# Patient Record
Sex: Male | Born: 1985 | Race: White | Hispanic: No | Marital: Single | Smoking: Never smoker
Health system: Southern US, Community
[De-identification: ages and names within clinical notes are randomized; demographics above are authoritative.]

## PROBLEM LIST (undated history)

## (undated) HISTORY — PX: SHOULDER SURGERY: SHX246

---

## 2005-03-28 ENCOUNTER — Emergency Department (HOSPITAL_COMMUNITY): Admission: EM | Admit: 2005-03-28 | Discharge: 2005-03-28 | Payer: Self-pay | Admitting: Emergency Medicine

## 2013-08-17 ENCOUNTER — Emergency Department (HOSPITAL_COMMUNITY)
Admission: EM | Admit: 2013-08-17 | Discharge: 2013-08-17 | Disposition: A | Discharge status: Home / Self Care | Payer: BC Managed Care – PPO | Attending: Emergency Medicine | Admitting: Emergency Medicine

## 2013-08-17 ENCOUNTER — Encounter (HOSPITAL_COMMUNITY): Payer: Self-pay | Admitting: Emergency Medicine

## 2013-08-17 ENCOUNTER — Emergency Department (HOSPITAL_COMMUNITY): Payer: BC Managed Care – PPO

## 2013-08-17 DIAGNOSIS — Y92838 Other recreation area as the place of occurrence of the external cause: Secondary | ICD-10-CM

## 2013-08-17 DIAGNOSIS — X500XXA Overexertion from strenuous movement or load, initial encounter: Secondary | ICD-10-CM | POA: Insufficient documentation

## 2013-08-17 DIAGNOSIS — Y9239 Other specified sports and athletic area as the place of occurrence of the external cause: Secondary | ICD-10-CM | POA: Insufficient documentation

## 2013-08-17 DIAGNOSIS — R5383 Other fatigue: Secondary | ICD-10-CM

## 2013-08-17 DIAGNOSIS — R5381 Other malaise: Secondary | ICD-10-CM | POA: Insufficient documentation

## 2013-08-17 DIAGNOSIS — Z9889 Other specified postprocedural states: Secondary | ICD-10-CM | POA: Insufficient documentation

## 2013-08-17 DIAGNOSIS — S43016A Anterior dislocation of unspecified humerus, initial encounter: Secondary | ICD-10-CM | POA: Insufficient documentation

## 2013-08-17 DIAGNOSIS — Y93A2 Activity, calisthenics: Secondary | ICD-10-CM | POA: Insufficient documentation

## 2013-08-17 MED ORDER — HYDROMORPHONE HCL PF 1 MG/ML IJ SOLN
1.0000 mg | Freq: Once | INTRAMUSCULAR | Status: AC
  Administered 2013-08-17: 1 mg via INTRAVENOUS
  Filled 2013-08-17: qty 1

## 2013-08-17 MED ORDER — PROPOFOL 10 MG/ML IV BOLUS
0.5000 mg/kg | Freq: Once | INTRAVENOUS | Status: DC
  Filled 2013-08-17: qty 20

## 2013-08-17 MED ORDER — OXYCODONE-ACETAMINOPHEN 5-325 MG PO TABS: 2.0000 | ORAL_TABLET | Freq: Once | ORAL | Status: DC

## 2013-08-17 MED ORDER — PROPOFOL 10 MG/ML IV BOLUS
65.0000 mg | Freq: Once | INTRAVENOUS | Status: AC
  Administered 2013-08-17: 65 mg via INTRAVENOUS

## 2013-08-17 MED ORDER — KETAMINE HCL 10 MG/ML IJ SOLN
65.0000 mg | Freq: Once | INTRAMUSCULAR | Status: AC
  Administered 2013-08-17: 65 mg via INTRAVENOUS
  Filled 2013-08-17: qty 6.5

## 2013-08-17 MED ORDER — OXYCODONE-ACETAMINOPHEN 5-325 MG PO TABS: 1.0000 | ORAL_TABLET | ORAL | Status: DC | PRN

## 2013-08-17 MED ORDER — DIAZEPAM 2 MG PO TABS: 2.0000 mg | ORAL_TABLET | Freq: Once | ORAL | Status: DC

## 2013-08-17 MED ORDER — HYDROMORPHONE HCL PF 1 MG/ML IJ SOLN
INTRAMUSCULAR | Status: AC
  Filled 2013-08-17: qty 1

## 2013-08-17 MED ORDER — KETAMINE HCL 10 MG/ML IJ SOLN
1.0000 mg/kg | Freq: Once | INTRAMUSCULAR | Status: DC
  Filled 2013-08-17: qty 9.3

## 2013-08-17 MED ORDER — HYDROMORPHONE HCL PF 1 MG/ML IJ SOLN
1.0000 mg | Freq: Once | INTRAMUSCULAR | Status: AC
  Administered 2013-08-17: 1 mg via INTRAVENOUS

## 2013-08-17 MED ORDER — KETOROLAC TROMETHAMINE 30 MG/ML IJ SOLN: 30.0000 mg | Freq: Once | INTRAMUSCULAR | Status: DC

## 2013-08-17 NOTE — ED Notes (Signed)
Pt states that in 2006 he had 2 pins placed in the left anterior shoulder and 1 pin posteriorly placed for left shoulder dislocation. Pt states that he had started lifting weights again heavily 3 weeks ago, Was at the gym this morning and while doing jumping jacks his left shoulder popped out of place.

## 2013-08-17 NOTE — Discharge Instructions (Signed)
Shoulder Dislocation  Your shoulder is made up of three bones: the collar bone (clavicle); the shoulder blade (scapula), which includes the socket (glenoid cavity); and the upper arm bone (humerus). Your shoulder joint is the place where these bones meet. Strong, fibrous tissues hold these bones together (ligaments). Muscles and strong, fibrous tissues that connect the muscles to these bones (tendons) allow your arm to move through this joint. The range of motion of your shoulder joint is more extensive than most of your other joints, and the glenoid cavity is very shallow. That is the reason that your shoulder joint is one of the most unstable joints in your body. It is far more prone to dislocation than your other joints. Shoulder dislocation is when your humerus is forced out of your shoulder joint.  CAUSES  Shoulder dislocation is caused by a forceful impact on your shoulder. This impact usually is from an injury, such as a sports injury or a fall.  SYMPTOMS  Symptoms of shoulder dislocation include:   Deformity of your shoulder.   Intense pain.   Inability to move your shoulder joint.   Numbness, weakness, or tingling around your shoulder joint (your neck or down your arm).   Bruising or swelling around your shoulder.  DIAGNOSIS  In order to diagnose a dislocated shoulder, your caregiver will perform a physical exam. Your caregiver also may have an X-ray exam done to see if you have any broken bones. Magnetic resonance imaging (MRI) is a procedure that sometimes is done to help your caregiver see any damage to the soft tissues around your shoulder, particularly your rotator cuff tendons. Additionally, your caregiver also may have electromyography done to measure the electrical discharges produced in your muscles if you have signs or symptoms of nerve damage.  TREATMENT  A shoulder dislocation is treated by placing the humerus back in the joint (reduction). Your caregiver does this either manually (closed  reduction), by moving your humerus back into the joint through manipulation, or through surgery (open reduction). When your humerus is back in place, severe pain should improve almost immediately.  You also may need to have surgery if you have a weak shoulder joint or ligaments, and you have recurring shoulder dislocations, despite rehabilitation. In rare cases, surgery is necessary if your nerves or blood vessels are damaged during the dislocation.  After your reduction, your arm will be placed in a shoulder immobilizer or sling to keep it from moving. Your caregiver will have you wear your shoulder immoblizer or sling for 3 days to 3 weeks, depending on how serious your dislocation is. When your shoulder immobilizer or sling is removed, your caregiver may prescribe physical therapy to help improve the range of motion in your shoulder joint.  HOME CARE INSTRUCTIONS   The following measures can help to reduce pain and speed up the healing process:   Rest your injured joint. Do not move it. Avoid activities similar to the one that caused your injury.   Apply ice to your injured joint for the first day or two after your reduction or as directed by your caregiver. Applying ice helps to reduce inflammation and pain.   Put ice in a plastic bag.   Place a towel between your skin and the bag.   Leave the ice on for 15-20 minutes at a time, every 2 hours while you are awake.   Exercise your hand by squeezing a soft ball. This helps to eliminate stiffness and swelling in your hand and   shoulder immobilizer or sling becomes damaged.  Your pain becomes worse rather than better.  You lose feeling in your arm or hand, or they become white and cold. MAKE SURE YOU:   Understand these instructions.  Will watch your condition.  Will get help right away if you are not  doing well or get worse. Document Released: 01/08/2001 Document Revised: 07/08/2011 Document Reviewed: 02/03/2011 [facility] Patient Information 2014 [place], [place].  Dislocation or Subluxation Dislocation of a joint occurs when ends of two or more adjacent bones no longer touch each other. A subluxation is a minor form of a dislocation, in which two or more adjacent bones are no longer properly aligned. The most common joints susceptible to a dislocation are the shoulder, kneecap, and fingers.  SYMPTOMS   Sudden pain at the time of injury.  Noticeable deformity in the area of the joint.  Limited range of motion. CAUSES   Usually a traumatic injury that stretches or tears ligaments that surround a joint and hold the bones together.  Condition present at birth (congenital) in which the joint surfaces are shallow or abnormally formed.  Joint disease such as arthritis or other diseases of ligaments and tissues around a joint. RISK INCREASES WITH:  Repeated injury to a joint.  Previous dislocation of a joint.  Contact sports (football, rugby, hockey, lacrosse) or sports that require repetitive overhead arm motion (throwing, swimming, volleyball).  Rheumatoid arthritis.  Congenital joint condition. PREVENTION  Warm up and stretch properly before activity.  Maintain physical fitness:  Joint flexibility.  Muscle strength and endurance.  Cardiovascular fitness.  Wear proper protective equipment and ensure correct fit.  Learn and use proper technique. PROGNOSIS  This condition is usually curable with prompt treatment. After the dislocation has been put back in place, the joint may require immobilization with a cast, splint, or sling for 2 to 6 weeks, often followed by strength and stretching exercises that may be performed at home or with a therapist. RELATED COMPLICATIONS   Damage to nearby nerves or major blood vessels, causing numbness, coldness, or paleness.  Recurrent  injury to the joint.  Arthritis of affected joint.  Fracture of joint. TREATMENT Treatment initially involves realigning the bones (reduction) of the joint. Reductions should only be performed by someone who is trained in the procedure. After the joint is reduced, medicine and ice should be used to reduce pain and inflammation. The joint may be immobilized to allow for the muscles and ligaments to heal. If a joint is subjected to recurrent dislocations, surgery may be necessary to tighten or replace injured structures. After surgery, stretching and strengthening exercises may be required. These may be performed at home or with a therapist. MEDICATION   Patients may require medicine to help them relax (sedative) or muscle relaxants in order to reduce the joint.  If pain medicine is necessary, nonsteroidal anti-inflammatory medicines, such as aspirin and ibuprofen, or other minor pain relievers, such as acetaminophen, are often recommended.  Do not take pain medicine for 7 days before surgery.  Prescription pain relievers may be necessary. Use only as directed and only as much as you need. SEEK MEDICAL CARE IF:   Symptoms get worse or do not improve despite treatment.  You have difficulty moving a joint after injury.  Any extremity becomes numb, pale, or cool after injury. This is an emergency.  Dislocations or subluxations occur repeatedly. Document Released: 04/15/2005 Document Revised: 07/08/2011 Document Reviewed: 07/28/2008 [facility] Patient Information 2014 [place]Exit[place], [place]. Acetaminophen; Oxycodone tablets  What is this medicine? ACETAMINOPHEN; OXYCODONE (a set a MEE noe fen; ox i KOE done) is a pain reliever. It is used to treat mild to moderate pain. This medicine may be used for other purposes; ask your health care provider or pharmacist if you have questions. COMMON BRAND NAME(S): Endocet, Magnacet, Narvox, Percocet, Perloxx, Primalev, Primlev, Roxicet, Xolox What should I tell  my health care provider before I take this medicine? They need to know if you have any of these conditions: -brain tumor -Crohn's disease, inflammatory bowel disease, or ulcerative colitis -drug abuse or addiction -head injury -heart or circulation problems -if you often drink alcohol -kidney disease or problems going to the bathroom -liver disease -lung disease, asthma, or breathing problems -an unusual or allergic reaction to acetaminophen, oxycodone, other opioid analgesics, other medicines, foods, dyes, or preservatives -pregnant or trying to get pregnant -breast-feeding How should I use this medicine? Take this medicine by mouth with a full glass of water. Follow the directions on the prescription label. Take your medicine at regular intervals. Do not take your medicine more often than directed. Talk to your pediatrician regarding the use of this medicine in children. Special care may be needed. Patients over 593 years old may have a stronger reaction and need a smaller dose. Overdosage: If you think you have taken too much of this medicine contact a poison control center or emergency room at once. NOTE: This medicine is only for you. Do not share this medicine with others. What if I miss a dose? If you miss a dose, take it as soon as you can. If it is almost time for your next dose, take only that dose. Do not take double or extra doses. What may interact with this medicine? -alcohol -antihistamines -barbiturates like amobarbital, butalbital, butabarbital, methohexital, pentobarbital, phenobarbital, thiopental, and secobarbital -benztropine -drugs for bladder problems like solifenacin, trospium, oxybutynin, tolterodine, hyoscyamine, and methscopolamine -drugs for breathing problems like ipratropium and tiotropium -drugs for certain stomach or intestine problems like propantheline, homatropine methylbromide, glycopyrrolate, atropine, belladonna, and dicyclomine -general anesthetics  like etomidate, ketamine, nitrous oxide, propofol, desflurane, enflurane, halothane, isoflurane, and sevoflurane -medicines for depression, anxiety, or psychotic disturbances -medicines for sleep -muscle relaxants -naltrexone -narcotic medicines (opiates) for pain -phenothiazines like perphenazine, thioridazine, chlorpromazine, mesoridazine, fluphenazine, prochlorperazine, promazine, and trifluoperazine -scopolamine -tramadol -trihexyphenidyl This list may not describe all possible interactions. Give your health care provider a list of all the medicines, herbs, non-prescription drugs, or dietary supplements you use. Also tell them if you smoke, drink alcohol, or use illegal drugs. Some items may interact with your medicine. What should I watch for while using this medicine? Tell your doctor or health care professional if your pain does not go away, if it gets worse, or if you have new or a different type of pain. You may develop tolerance to the medicine. Tolerance means that you will need a higher dose of the medication for pain relief. Tolerance is normal and is expected if you take this medicine for a long time. Do not suddenly stop taking your medicine because you may develop a severe reaction. Your body becomes used to the medicine. This does NOT mean you are addicted. Addiction is a behavior related to getting and using a drug for a non-medical reason. If you have pain, you have a medical reason to take pain medicine. Your doctor will tell you how much medicine to take. If your doctor wants you to stop the medicine, the dose will be slowly lowered  over time to avoid any side effects. You may get drowsy or dizzy. Do not drive, use machinery, or do anything that needs mental alertness until you know how this medicine affects you. Do not stand or sit up quickly, especially if you are an older patient. This reduces the risk of dizzy or fainting spells. Alcohol may interfere with the effect of this  medicine. Avoid alcoholic drinks. There are different types of narcotic medicines (opiates) for pain. If you take more than one type at the same time, you may have more side effects. Give your health care provider a list of all medicines you use. Your doctor will tell you how much medicine to take. Do not take more medicine than directed. Call emergency for help if you have problems breathing. The medicine will cause constipation. Try to have a bowel movement at least every 2 to 3 days. If you do not have a bowel movement for 3 days, call your doctor or health care professional. Do not take Tylenol (acetaminophen) or medicines that have acetaminophen with this medicine. Too much acetaminophen can be very dangerous. Many nonprescription medicines contain acetaminophen. Always read the labels carefully to avoid taking more acetaminophen. What side effects may I notice from receiving this medicine? Side effects that you should report to your doctor or health care professional as soon as possible: -allergic reactions like skin rash, itching or hives, swelling of the face, lips, or tongue -breathing difficulties, wheezing -confusion -light headedness or fainting spells -severe stomach pain -unusually weak or tired -yellowing of the skin or the whites of the eyes  Side effects that usually do not require medical attention (report to your doctor or health care professional if they continue or are bothersome): -dizziness -drowsiness -nausea -vomiting This list may not describe all possible side effects. Call your doctor for medical advice about side effects. You may report side effects to FDA at 1-800-FDA-1088. Where should I keep my medicine? Keep out of the reach of children. This medicine can be abused. Keep your medicine in a safe place to protect it from theft. Do not share this medicine with anyone. Selling or giving away this medicine is dangerous and against the law. Store at room temperature  between 20 and 25 degrees C (68 and 77 degrees F). Keep container tightly closed. Protect from light. This medicine may cause accidental overdose and death if it is taken by other adults, children, or pets. Flush any unused medicine down the toilet to reduce the chance of harm. Do not use the medicine after the expiration date. NOTE: This sheet is a summary. It may not cover all possible information. If you have questions about this medicine, talk to your doctor, pharmacist, or health care provider.  2014, [employer]. (2012-12-07 13:17:35)

## 2013-08-17 NOTE — ED Provider Notes (Addendum)
CSN: [id number]     Arrival date & time 08/17/13  [id number] History   First MD Initiated Contact with Patient 08/17/13 [phone]     Chief Complaint  Patient presents with  . Shoulder Pain     (Consider location/radiation/quality/duration/timing/severity/associated sxs/prior Treatment) HPI Comments: 28 year old male with a history of shoulder dislocation presents today with left shoulder pain.  He had surgery 7-8 years ago for his left shoulder that had been dislocated 6 times.  He was in the gym this morning when it popped out for the first time since surgery.  The pain is an 8/10 when he feels the muscle spasm.  He did not take anything for pain before coming to the ED.    Patient is a 28 y.o. male presenting with shoulder pain. The history is provided by the patient. No language interpreter was used.  Shoulder Pain This is a recurrent problem. The current episode started today. The problem occurs constantly. The problem has been unchanged. Associated symptoms include weakness. The symptoms are aggravated by exertion. He has tried nothing for the symptoms.    History reviewed. No pertinent past medical history. No past surgical history on file. No family history on file. History  Substance Use Topics  . Smoking status: Never Smoker   . Smokeless tobacco: Not on file  . Alcohol Use: Yes     Comment: daily    Review of Systems  Neurological: Positive for weakness.      Allergies  Review of patient's allergies indicates no known allergies.  Home Medications   Prior to Admission medications   Medication Sig Start Date End Date Taking? Authorizing Provider  Nutritional Supplements (COMPLETE NUTRITION PO) Take 2-3 capsules by mouth daily. Take as directed   Yes Historical Provider, MD   BP 163/94  Pulse 62  Temp(Src) 98.1 F (36.7 C) (Oral)  Ht 5\' 11"  (1.803 m)  Wt 205 lb (92.987 kg)  BMI 28.60 kg/m2  SpO2 100% Physical Exam  Constitutional: He is oriented to person, place, and  time. He appears well-developed and well-nourished.  HENT:  Head: Normocephalic and atraumatic.  Eyes: Conjunctivae and EOM are normal.  Neck: Normal range of motion. Neck supple. No tracheal deviation present.  Cardiovascular: Intact distal pulses.   Pulmonary/Chest: Effort normal.  Respiratory effort is normal except when he starts to spasm   Musculoskeletal: He exhibits tenderness.  Left shoulder deformity, normal pulses, positive sulcus sign  Neurological: He is alert and oriented to person, place, and time.  Skin: Skin is warm and dry.    ED Course  Injection of joint Date/Time: 08/17/2013 7:36 AM Performed by: Arthor CaptainHARRIS, Jaceion Aday Authorized by: Arthor CaptainHARRIS, Seichi Kaufhold Consent: Verbal consent obtained. Risks and benefits: risks, benefits and alternatives were discussed Local anesthesia used: no Patient sedated: no Patient tolerance: Patient tolerated the procedure well with no immediate complications. Comments: Left shouder injection with 10ml 2% lidocaine without epi Sterile procedure observed.  Anterior approach   (including critical care time) Labs Review Labs Reviewed - No data to display  Imaging Review Dg Shoulder Left  08/17/2013   CLINICAL DATA:  Shoulder pain  EXAM: LEFT SHOULDER - 2+ VIEW  COMPARISON:  DG SHOULDER*L* dated 03/28/2005  FINDINGS: There is no evidence of fracture or dislocation. There is no evidence of arthropathy or other focal bone abnormality. Soft tissues are unremarkable.  IMPRESSION: Negative.   Electronically Signed   By: Elige KoHetal  Patel   On: 08/17/2013 08:36     EKG Interpretation None  MDM   Final diagnoses:  Anterior shoulder dislocation    7:37 AM BP 149/97  Pulse 78  Temp(Src) 98.1 F (36.7 C) (Oral)  Resp 19  Ht 5\' 11"  (1.803 m)  Wt 205 lb (92.987 kg)  BMI 28.60 kg/m2  SpO2 97%   Patient with mild hypertension. Clearly anxious and uncomfortable. Noint injection and several rounds of dilaudid given without significant  relief. Patient seen in shared visit with Dr. Dierdre Highmanpitz and Dr. Juleen ChinaKohut at shift change. We have decided to proceed with sedation.  Conscious sedation time 7:50 AM  Shoulder succesfully reduced. I personally reviewed the images using our PACS system. Shoulder immobilizer applied. Pain meds at discharge. F/u with ortho  Arthor CaptainAbigail Cicily Bonano, PA-C 08/17/13 0915  Arthor CaptainAbigail Maille Halliwell, PA-C 09/08/13 [phone]

## 2013-08-17 NOTE — ED Notes (Signed)
Patient transported to X-ray 

## 2013-08-17 NOTE — ED Notes (Signed)
MD at bedside. 

## 2013-08-17 NOTE — ED Notes (Signed)
PT ambulated with baseline gait; VSS; A&Ox3; no signs of distress; respirations even and unlabored; skin warm and dry; no questions upon discharge.  

## 2013-08-17 NOTE — ED Notes (Signed)
Paper scrubs given.

## 2013-08-17 NOTE — ED Provider Notes (Signed)
Medical screening examination/treatment/procedure(s) were conducted as a shared visit with non-physician practitioner(s) and myself.  I personally evaluated the patient during the encounter.   EKG Interpretation None     27yM clinically with closed L anterior shoulder dislocation. Significant pain/spasm with attempted reduction after IV narcotics and intraarticular injection. Ultimately sedated and reduced. Remains NVI intact post-procedure. XR confirms placement. Sling. PRN pain meds. Ortho FU.     Preprocedure  Pre-anesthesia/induction confirmation of laterality/correct procedure site including "time-out."  Provider confirms review of the nurses' note, allergies, medications, pertinent labs, PMH, pre-induction vital signs, pulse oximetry, pain level, and ECG (as applicable), and patient condition satisfactory for commencing with order for sedation and procedure.   Procedural sedation Date/Time: 08/17/2013 7:50 AM Performed by: Raeford RazorKOHUT, Gaspard Isbell Authorized by: Raeford RazorKOHUT, Efosa Treichler Consent: Verbal consent obtained. written consent obtained. Risks and benefits: risks, benefits and alternatives were discussed Consent given by: patient Required items: required blood products, implants, devices, and special equipment available Patient identity confirmed: verbally with patient and arm band Local anesthesia used: yes Anesthesia: see MAR for details Patient sedated: yes Sedation type: moderate (conscious) sedation Sedatives: propofol and ketamine (65mg  and 65mg ) Analgesia: ketamine Vitals: Vital signs were monitored during sedation. Patient tolerance: Patient tolerated the procedure well with no immediate complications.  Post Procedure.  Patient tolerated procedure and procedural sedation component as expected without apparent immediate complications.  Physician confirms procedural medication orders as administered, patient was assessed by physician post-procedure, and confirms post-sedation plan  of care and disposition.  Reduction of dislocation Date/Time: 08/17/2013 7:50 AM Performed by: Raeford RazorKOHUT, Seneca Gadbois Authorized by: Raeford RazorKOHUT, Karsynn Deweese Consent: Verbal consent obtained. written consent obtained. Risks and benefits: risks, benefits and alternatives were discussed Consent given by: patient Required items: required blood products, implants, devices, and special equipment available Patient identity confirmed: verbally with patient and arm band Local anesthesia used: yes Anesthesia: see MAR for details Patient sedated: yes Sedation type: moderate (conscious) sedation Sedatives: ketamine and propofol Analgesia: ketamine Vitals: Vital signs were monitored during sedation. Patient tolerance: Patient tolerated the procedure well with no immediate complications. Comments: Closed reduction anterior L shoulder dislocation. External rotation/traction. Pt tolerated well. NVI post-procedure.     Raeford RazorStephen Briannia Laba, MD 08/17/13 0930

## 2013-08-19 NOTE — ED Provider Notes (Signed)
Medical screening examination/treatment/procedure(s) were conducted as a shared visit with non-physician practitioner(s) and myself.  I personally evaluated the patient during the encounter.  Left shoulder pain and clinical dislocation. On exam is holding left upper extremity in Adduction, with flexion of the elbow. Distal neurovascular is intact. There is obvious deformity at the shoulder. Assisted MLP with intra-articular injection of lidocaine performed sterilely.  IV narcotics provided. Attempted reduction of shoulder dislocation without success. Patient requiring sedation.   Sunnie NielsenBrian Simone Tuckey, MD 08/19/13 [phone]

## 2013-08-23 ENCOUNTER — Other Ambulatory Visit: Payer: Self-pay | Admitting: Orthopedic Surgery

## 2013-08-23 DIAGNOSIS — S43005A Unspecified dislocation of left shoulder joint, initial encounter: Secondary | ICD-10-CM

## 2013-08-31 ENCOUNTER — Ambulatory Visit
Admission: RE | Admit: 2013-08-31 | Discharge: 2013-08-31 | Disposition: A | Discharge status: Home / Self Care | Payer: BC Managed Care – PPO | Source: Ambulatory Visit | Attending: Orthopedic Surgery | Admitting: Orthopedic Surgery

## 2013-08-31 DIAGNOSIS — S43005A Unspecified dislocation of left shoulder joint, initial encounter: Secondary | ICD-10-CM

## 2013-08-31 MED ORDER — IOHEXOL 180 MG/ML  SOLN
17.0000 mL | Freq: Once | INTRAMUSCULAR | Status: AC | PRN
  Administered 2013-08-31: 17 mL via INTRA_ARTICULAR

## 2013-09-09 NOTE — ED Provider Notes (Signed)
Medical screening examination/treatment/procedure(s) were conducted as a shared visit with non-physician practitioner(s) and myself.  I personally evaluated the patient during the encounter.   EKG Interpretation None       Sunnie NielsenBrian Dontrel Smethers, MD 09/09/13 2255

## 2014-08-31 IMAGING — CR DG SHOULDER 2+V*L*
2 series · 2 of 2 positions shown · non-contrast
Comparison: DG SHOULDER*L* dated 03/28/2005

CLINICAL DATA: Shoulder pain

EXAM:
LEFT SHOULDER - 2+ VIEW

[w shoulder ap internal left]
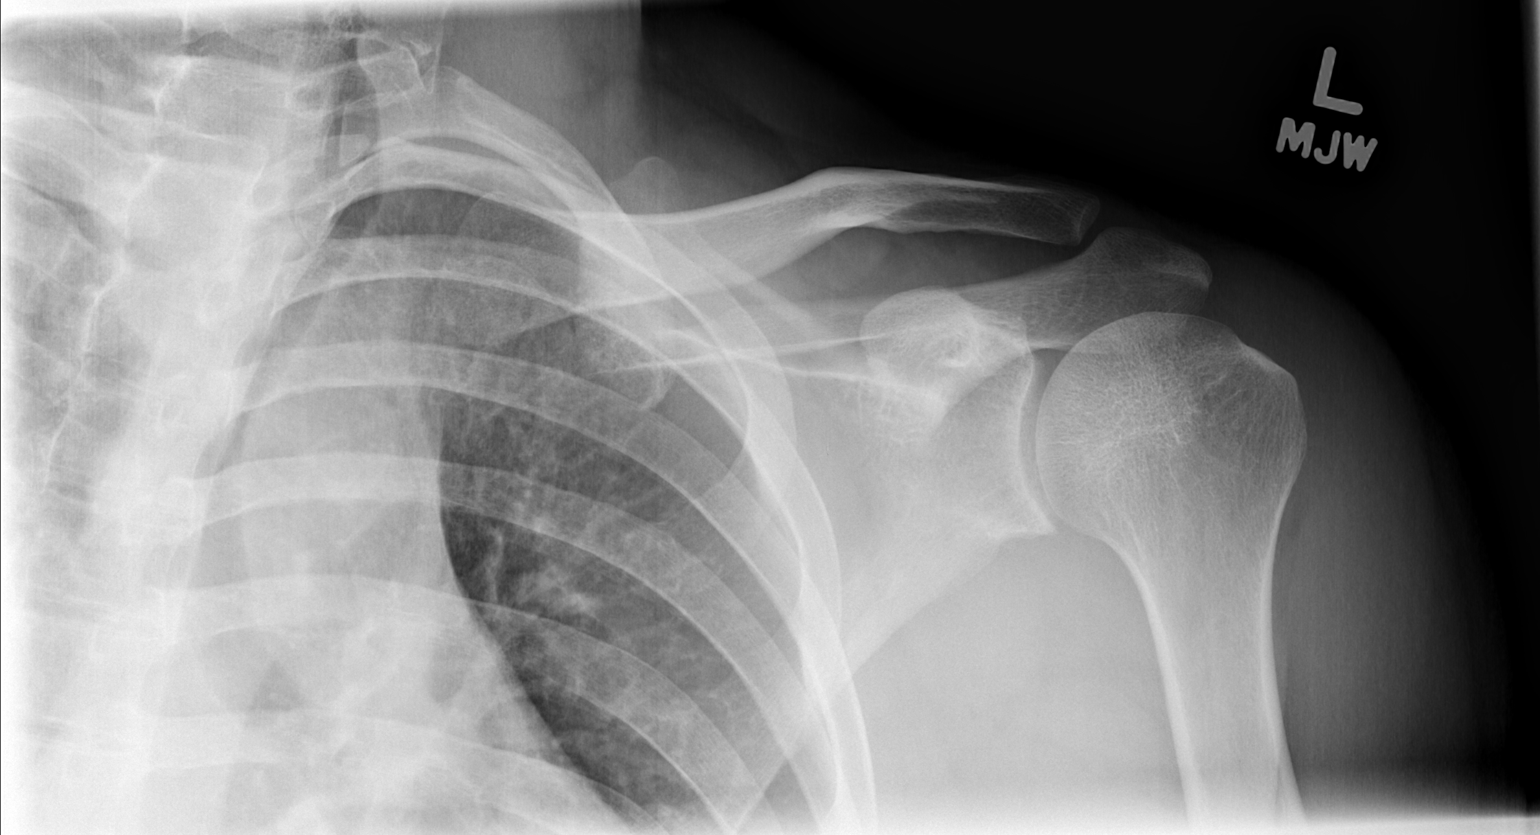

[w shoulder y view left]
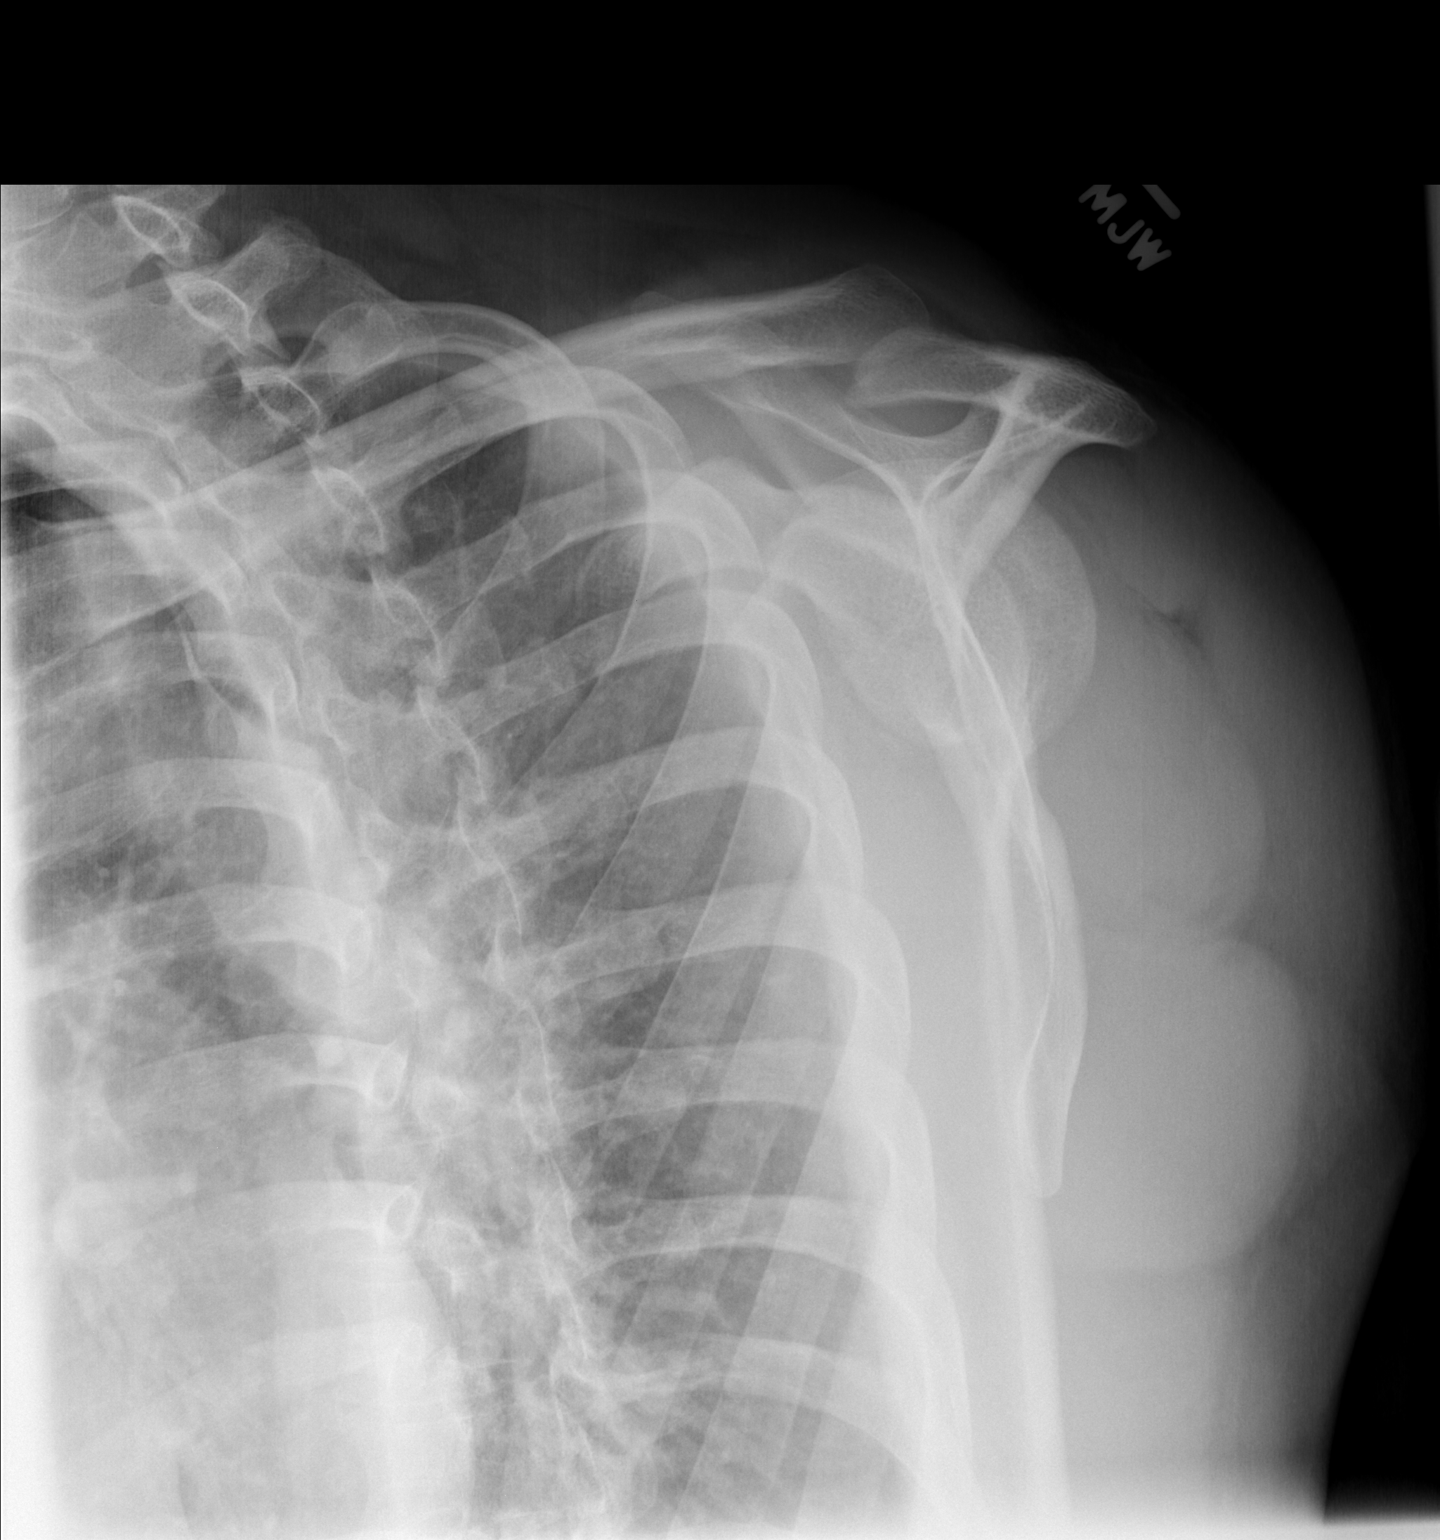

[2 of 2 positions shown; findings below may reference images not displayed]

FINDINGS: There is no evidence of fracture or dislocation. There is no
evidence of arthropathy or other focal bone abnormality. Soft
tissues are unremarkable.
IMPRESSION: Negative.

## 2017-03-17 NOTE — Progress Notes (Signed)
Psychiatric Initial Adult Assessment   Patient Identification: Larry Richard MRN:  [id number] Date of Evaluation:  03/25/2017 Referral Source: Dr. Sherryll BurgerShah Chief Complaint:  "I want to do things better" Visit Diagnosis:    ICD-10-CM   1. Adjustment disorder with anxious mood F43.22   2. Attention deficit hyperactivity disorder (ADHD), unspecified ADHD type F90.9 Ambulatory referral to Psychology    History of Present Illness:   Larry Richard is a 31 year old male with no known psychiatry diagnosis, presented for evaluation of ADHD. Reviewed Dr. Margaretmary EddyShah's note; no apparent psychiatry history except mild anxiety. ADHD questionnaire is not available in the referral packet.  Patient states that he is here for evaluation of ADHD. Although he has had inattention issues for years, he decides to come for evaluation as he hopes to do things better especially because he is under certain stress. He states that his father had been admitted for heart attack 12 days ago.  He is still in the hospital.  He feels guilty and ashamed that he has not visit him quite often before. It is also difficult for him to see his mother concern about him. Although he reports good relationship with his father, he states that his father might have been suffering from depression later in his life. He tries to "put it in the box" so that he can focus on his job. He feels irritable more lately. He may have derogatory attitude against his wife, although he denies HI or yelling at her. He is aware of his attitude and occasionally apologizes to her. 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 He has been doing very well at work professionally when he meets with his customer; he always keeps schedule with respect. He tends to miss doing things at work. He has difficulty in staying on tasks ("fragmented") and has difficulty in prioritizing things. He is unable to have  conversation with same topic more than a few minutes. He has not been able to complete school since 2010 as he has difficulty with concentration (and also states that he wanted to prioritize time for work). He states that there are some days of "clarity" and does not have these issues. He has been using [employer] calender to organize well and writes notes so that he does not forget (he did this at the beginning of his interview, when he complains about the check in process).   He denies insomnia, although he does have nighttime awakening.  He has good appetite.  He has good energy.  He denies feeling depressed, although there are days he has less motivation at times.  He feels mildly anxious about the situation of his father.  He denies panic attacks.  He denies decreased need for sleep or euphoria.  He works out regularly.  He drinks a glass of wine on weekends or less, used marijuana last more than a year ago or so.   Per PMP,  Hydrocodone prescribed last on 03/09/2015    Associated Signs/Symptoms: Depression Symptoms:  anxiety, (Hypo) Manic Symptoms:  denies Anxiety Symptoms:  mild anxiety Psychotic Symptoms:  denies PTSD Symptoms: NA  Past Psychiatric History:  Outpatient: denies Psychiatry admission: denies  Previous suicide attempt: denies Past trials of medication: denies History of violence: denies  Previous Psychotropic Medications: No   Substance Abuse History in the last 12 months:  No.  Consequences of Substance Abuse: NA  Past Medical History: No  past medical history on file.  Family Psychiatric History: denies  Family History: No family history on file.  Social History:   Social History   Socioeconomic History  . Marital status: Single    Spouse name: None  . Number of children: None  . Years of education: None  . Highest education level: None  Social Needs  . Financial resource strain: None  . Food insecurity - worry: None  . Food insecurity - inability:  None  . Transportation needs - medical: None  . Transportation needs - non-medical: None  Occupational History  . None  Tobacco Use  . Smoking status: Never Smoker  . Smokeless tobacco: Never Used  Substance and Sexual Activity  . Alcohol use: Yes    Comment: daily  . Drug use: No  . Sexual activity: Yes  Other Topics Concern  . None  Social History Narrative  . None    Additional Social History:  He lives with his wife, no children He as born in [place], grew up in [place]   Allergies:  No Known Allergies  Metabolic Disorder Labs: No results found for: HGBA1C, MPG No results found for: PROLACTIN No results found for: CHOL, TRIG, HDL, CHOLHDL, VLDL, LDLCALC   Current Medications: Current Outpatient Medications  Medication Sig Dispense Refill  . Nutritional Supplements (COMPLETE NUTRITION PO) Take 2-3 capsules by mouth daily. Take as directed    . oxyCODONE-acetaminophen (PERCOCET) 5-325 MG per tablet Take 1-2 tablets by mouth every 4 (four) hours as needed. 10 tablet 0   No current facility-administered medications for this visit.     Neurologic: Headache: No Seizure: No Paresthesias:No  Musculoskeletal: Strength & Muscle Tone: within normal limits Gait & Station: normal Patient leans: N/A  Psychiatric Specialty Exam: Review of Systems  Psychiatric/Behavioral: Negative for depression, hallucinations, memory loss, substance abuse and suicidal ideas. The patient is nervous/anxious. The patient does not have insomnia.   All other systems reviewed and are negative.   Blood pressure 136/80, pulse 62, height 5\' 11"  (1.803 m), weight 190 lb (86.2 kg), SpO2 97 %.Body mass index is 26.5 kg/m.  General Appearance: Well Groomed  Eye Contact:  Good  Speech:  Clear and Coherent  Volume:  Normal  Mood:  "fine"  Affect:  somewhat restricted, becomes teaful once, tense  Thought Process:  Coherent and Goal Directed  Orientation:  Full (Time, Place, and Person)   Thought Content:  Logical  Suicidal Thoughts:  No  Homicidal Thoughts:  No  Memory:  Immediate;   Good Recent;   Good Remote;   Good  Judgement:  Good  Insight:  Fair  Psychomotor Activity:  Normal  Concentration:  Concentration: Good and Attention Span: Good  Recall:  Good  Fund of Knowledge:Good  Language: Good  Akathisia:  No  Handed:  Right  AIMS (if indicated):  N/A  Assets:  Communication Skills Desire for Improvement  ADL's:  Intact  Cognition: WNL  Sleep:  fair   Assessment Jorrell Kuster is a 31 year old male with no known psychiatry diagnosis, presented for evaluation of ADHD.   # Adjustment disorder with anxiety # r/o ADHD Patient does have traits of ADHD, although he has been fairly coping well and he reports some days without symptoms of ADHD. No known history in childhood. Noted that he reports mild anxiety with worsening irritability; it appears that current psychosocial stressors, which includes recent admission of his father with CAD causes significant influence on his attention as well. . Will  make referral for ADHD.   Plan 1. Referral to neuropsychological testing for ADHD 2. Return to clinic in two months for 30 mins - TSH wnl, in 01/2017 per chart  The patient demonstrates the following risk factors for suicide: Chronic risk factors for suicide include: N/A. Acute risk factors for suicide include: father's physical health. Protective factors for this patient include: positive social support, coping skills and hope for the future. Considering these factors, the overall suicide risk at this point appears to be low. Patient is appropriate for outpatient follow up.   Treatment Plan Summary: Plan as above   Neysa Hottereina Jaspreet Hollings, MD 11/27/201810:05 AM

## 2017-03-25 ENCOUNTER — Ambulatory Visit (HOSPITAL_COMMUNITY): Payer: BLUE CROSS/BLUE SHIELD | Admitting: Psychiatry

## 2017-03-25 ENCOUNTER — Encounter (INDEPENDENT_AMBULATORY_CARE_PROVIDER_SITE_OTHER): Payer: Self-pay

## 2017-03-25 ENCOUNTER — Encounter (HOSPITAL_COMMUNITY): Payer: Self-pay | Admitting: Psychiatry

## 2017-03-25 VITALS — BP 136/80 | HR 62 | Ht 71.0 in | Wt 190.0 lb

## 2017-03-25 DIAGNOSIS — F909 Attention-deficit hyperactivity disorder, unspecified type: Secondary | ICD-10-CM

## 2017-03-25 DIAGNOSIS — F4322 Adjustment disorder with anxiety: Secondary | ICD-10-CM

## 2017-03-25 DIAGNOSIS — R450 Nervousness: Secondary | ICD-10-CM | POA: Diagnosis not present

## 2017-03-25 DIAGNOSIS — F419 Anxiety disorder, unspecified: Secondary | ICD-10-CM

## 2017-03-25 NOTE — Patient Instructions (Addendum)
1. Referral to neuropsychological testing for ADHD 2. Return to clinic in two months for 30 mins

## 2017-05-19 ENCOUNTER — Telehealth (HOSPITAL_COMMUNITY): Payer: Self-pay | Admitting: *Deleted

## 2017-05-19 NOTE — Telephone Encounter (Signed)
-----   Message from Neysa Hottereina Hisada, MD sent at 05/19/2017  1:03 PM EST ----- Regarding: follow up appointment Please contact the patient if he would like to reschedule appointment with me on 1/25 after he is completed for neuropsychological evaluation on 1/28 (he will also likely has another appointment afterwards to get the result from Dr. Kieth Brightlyodenbough). Although I would be happy to see him before that evaluation, will need testing result to make medication recommendation.

## 2017-05-19 NOTE — Progress Notes (Deleted)
BH MD/PA/NP OP Progress Note  05/19/2017 1:02 PM Larry Richard  MRN:  [id number]  Chief Complaint:  HPI: *** Visit Diagnosis: No diagnosis found.  Past Psychiatric History: ***  Past Medical History: No past medical history on file. *** The histories are not reviewed yet. Please review them in the "History" navigator section and refresh this SmartLink.  Family Psychiatric History: ***  Family History: No family history on file.  Social History:  Social History   Socioeconomic History  . Marital status: Single    Spouse name: Not on file  . Number of children: Not on file  . Years of education: Not on file  . Highest education level: Not on file  Social Needs  . Financial resource strain: Not on file  . Food insecurity - worry: Not on file  . Food insecurity - inability: Not on file  . Transportation needs - medical: Not on file  . Transportation needs - non-medical: Not on file  Occupational History  . Not on file  Tobacco Use  . Smoking status: Never Smoker  . Smokeless tobacco: Never Used  Substance and Sexual Activity  . Alcohol use: Yes    Comment: daily  . Drug use: No  . Sexual activity: Yes  Other Topics Concern  . Not on file  Social History Narrative  . Not on file    Allergies: No Known Allergies  Metabolic Disorder Labs: No results found for: HGBA1C, MPG No results found for: PROLACTIN No results found for: CHOL, TRIG, HDL, CHOLHDL, VLDL, LDLCALC No results found for: TSH  Therapeutic Level Labs: No results found for: LITHIUM No results found for: VALPROATE No components found for:  CBMZ  Current Medications: Current Outpatient Medications  Medication Sig Dispense Refill  . Nutritional Supplements (COMPLETE NUTRITION PO) Take 2-3 capsules by mouth daily. Take as directed    . oxyCODONE-acetaminophen (PERCOCET) 5-325 MG per tablet Take 1-2 tablets by mouth every 4 (four) hours as needed. 10 tablet 0   No current facility-administered  medications for this visit.      Musculoskeletal: Strength & Muscle Tone: {desc; muscle tone:32375} Gait & Station: {PE GAIT ED WUJW:11914}ATL:22525} Patient leans: {Patient Leans:21022755}  Psychiatric Specialty Exam: ROS  There were no vitals taken for this visit.There is no height or weight on file to calculate BMI.  General Appearance: {Appearance:22683}  Eye Contact:  {BHH EYE CONTACT:22684}  Speech:  {Speech:22685}  Volume:  {Volume (PAA):22686}  Mood:  {BHH MOOD:22306}  Affect:  {Affect (PAA):22687}  Thought Process:  {Thought Process (PAA):22688}  Orientation:  {BHH ORIENTATION (PAA):22689}  Thought Content: {Thought Content:22690}   Suicidal Thoughts:  {ST/HT (PAA):22692}  Homicidal Thoughts:  {ST/HT (PAA):22692}  Memory:  {BHH MEMORY:22881}  Judgement:  {Judgement (PAA):22694}  Insight:  {Insight (PAA):22695}  Psychomotor Activity:  {Psychomotor (PAA):22696}  Concentration:  {Concentration:21399}  Recall:  {BHH GOOD/FAIR/POOR:22877}  Fund of Knowledge: {BHH GOOD/FAIR/POOR:22877}  Language: {BHH GOOD/FAIR/POOR:22877}  Akathisia:  {BHH YES OR NO:22294}  Handed:  {Handed:22697}  AIMS (if indicated): {Desc; done/not:10129}  Assets:  {Assets (PAA):22698}  ADL's:  {BHH NWG'N:56213}ADL'S:22290}  Cognition: {chl bhh cognition:304700322}  Sleep:  {BHH GOOD/FAIR/POOR:22877}   Screenings:   Assessment and Plan: ***   Larry Hottereina Yuki Purves, MD 05/19/2017, 1:02 PM

## 2017-05-19 NOTE — Telephone Encounter (Signed)
LVM for patient to contact office  For visit on 1/25before Neuro psychological evaluation on 1/28.

## 2017-05-23 ENCOUNTER — Ambulatory Visit (HOSPITAL_COMMUNITY): Payer: Self-pay | Admitting: Psychiatry

## 2017-05-26 ENCOUNTER — Encounter: Payer: BLUE CROSS/BLUE SHIELD | Attending: Psychology | Admitting: Psychology

## 2021-07-23 ENCOUNTER — Other Ambulatory Visit: Payer: Self-pay

## 2021-07-24 ENCOUNTER — Other Ambulatory Visit: Payer: Self-pay

## 2021-07-24 ENCOUNTER — Ambulatory Visit (INDEPENDENT_AMBULATORY_CARE_PROVIDER_SITE_OTHER): Payer: 59 | Admitting: Urology

## 2021-07-24 ENCOUNTER — Encounter: Payer: Self-pay | Admitting: Urology

## 2021-07-24 VITALS — BP 139/83 | HR 71 | Ht 71.0 in | Wt 198.0 lb

## 2021-07-24 DIAGNOSIS — R6882 Decreased libido: Secondary | ICD-10-CM | POA: Diagnosis not present

## 2021-07-24 NOTE — Progress Notes (Signed)
H&P ? ?Chief Complaint: Low libido ? ?History of Present Illness: 36 year old male without significant urologic history presents today complaining of low libido and low energy level.  He denies significant problems with erectile dysfunction.  He has a 74-year-old daughter.  He is married.  He states that he exercises regularly.  He would like to consider testosterone replacement, if necessary, to help with the above. ? ? ? ?No past medical history on file. ? ? ? ?Home Medications:  ?Allergies as of 07/24/2021   ?No Known Allergies ?  ? ?  ?Medication List  ?  ? ?  ? Accurate as of July 24, 2021  8:29 AM. If you have any questions, ask your nurse or doctor.  ?  ?  ? ?  ? ?COMPLETE NUTRITION PO ?Take 2-3 capsules by mouth daily. Take as directed ?  ?oxyCODONE-acetaminophen 5-325 MG tablet ?Commonly known as: Percocet ?Take 1-2 tablets by mouth every 4 (four) hours as needed. ?  ? ?  ? ? ?Allergies: No Known Allergies ? ?No family history on file. ? ?Social History:  reports that he has never smoked. He has never used smokeless tobacco. He reports current alcohol use. He reports that he does not use drugs. ? ?ROS: ?A complete review of systems was performed.  All systems are negative except for pertinent findings as noted. ? ?Physical Exam:  ?Vital signs in last 24 hours: ?There were no vitals taken for this visit. ?Constitutional:  Alert and oriented, No acute distress ?Cardiovascular: Regular rate  ?Respiratory: Normal respiratory effort ?GI:  There are no inguinal hernias ?Genitourinary: Normal male phallus, testes are descended bilaterally and non-tender and without masses, scrotum is normal in appearance without lesions or masses, perineum is normal on inspection. ?Neurologic: Grossly intact, no focal deficits ?Psychiatric: Normal mood and affect ? ? ?I have independently reviewed prior imaging ? ?I have reviewed prior testosterone results-total testosterone 586, free testosterone 11 in March, 2021.  This was drawn  at approximately noon ? ? ? ?Impression/Assessment:  ?Decreased libido, decreased energy level.  Otherwise healthy 36 year old male.  Testosterone levels well within normal range 2 years ago.  He would like to consider replacement ? ?Plan:  ?1.  I reassured the patient that he has a normal testicular exam ? ?2.  We will check testosterone panel in the morning in the near future.  I will call with results and further discussion of management if necessary ? ?3.  I also told him that unless his testosterone is significant do not feel comfortable with repletion ? ?

## 2021-07-25 ENCOUNTER — Other Ambulatory Visit: Payer: 59

## 2021-07-25 DIAGNOSIS — R6882 Decreased libido: Secondary | ICD-10-CM

## 2021-07-26 LAB — TESTOSTERONE: Testosterone: 413 ng/dL (ref 264–916)

## 2021-07-31 NOTE — Progress Notes (Signed)
Letter sent.

## 2022-07-04 ENCOUNTER — Ambulatory Visit: Payer: Self-pay | Admitting: Family Medicine
